# Patient Record
Sex: Female | Born: 1975 | Hispanic: Yes | Marital: Married
Health system: Southern US, Community
[De-identification: ages and names within clinical notes are randomized; demographics above are authoritative.]

---

## 2015-08-24 HISTORY — PX: BREAST CYST EXCISION: SHX579

## 2019-12-12 ENCOUNTER — Ambulatory Visit: Payer: Self-pay

## 2019-12-19 ENCOUNTER — Other Ambulatory Visit: Payer: Self-pay

## 2019-12-19 ENCOUNTER — Ambulatory Visit: Payer: Self-pay

## 2020-01-23 ENCOUNTER — Other Ambulatory Visit: Payer: Self-pay

## 2020-01-23 ENCOUNTER — Ambulatory Visit: Payer: Self-pay | Attending: Oncology | Admitting: *Deleted

## 2020-01-23 VITALS — BP 124/72 | HR 71 | Temp 98.3°F | Ht <= 58 in | Wt 153.9 lb

## 2020-01-23 DIAGNOSIS — N63 Unspecified lump in unspecified breast: Secondary | ICD-10-CM

## 2020-01-24 NOTE — Progress Notes (Signed)
44 year old female presented to [facility] for clinical breast exam and mammogram.  It was noted during the health history that patient is insured by [employer].  She is currently teaching at [employer].  She states her last mammogram was in [place] in January of 2021.  States she is due for a 6 month follow up mammogram.  Patient has her images and the report with her.  Since she is not eligible for B[facility]CP with insurance, I have sent her to [facility] breast care center  To have her imaging downloaded.  She is going to call the [facility] to have them send the order so she can get scheduled.  Will also have Joellyn Quails call the clinic for an order and notify the patient.

## 2020-02-04 ENCOUNTER — Other Ambulatory Visit: Payer: Self-pay | Admitting: Physician Assistant

## 2020-02-04 DIAGNOSIS — R928 Other abnormal and inconclusive findings on diagnostic imaging of breast: Secondary | ICD-10-CM

## 2020-02-05 ENCOUNTER — Other Ambulatory Visit: Payer: Self-pay

## 2020-02-05 ENCOUNTER — Ambulatory Visit (LOCAL_COMMUNITY_HEALTH_CENTER): Payer: Self-pay

## 2020-02-05 DIAGNOSIS — Z111 Encounter for screening for respiratory tuberculosis: Secondary | ICD-10-CM

## 2020-02-08 ENCOUNTER — Telehealth: Payer: Self-pay

## 2020-02-08 ENCOUNTER — Other Ambulatory Visit: Payer: Self-pay

## 2020-02-08 ENCOUNTER — Ambulatory Visit (LOCAL_COMMUNITY_HEALTH_CENTER): Payer: Self-pay

## 2020-02-08 DIAGNOSIS — Z111 Encounter for screening for respiratory tuberculosis: Secondary | ICD-10-CM

## 2020-02-08 LAB — TB SKIN TEST
Induration: 0 mm
TB Skin Test: NEGATIVE

## 2020-02-08 NOTE — Telephone Encounter (Signed)
[facility] as scheduled for PPDR this am. Call to client with [facility] [employer] (ID # [phone]) and no answer / no voicemail set up. Jossie Ng, RN

## 2020-02-08 NOTE — Progress Notes (Signed)
Verified level of service completed. Jossie Ng, RN

## 2020-02-15 ENCOUNTER — Ambulatory Visit
Admission: RE | Admit: 2020-02-15 | Discharge: 2020-02-15 | Disposition: A | Discharge status: Home / Self Care | Payer: BC Managed Care – PPO | Source: Ambulatory Visit | Attending: Physician Assistant | Admitting: Physician Assistant

## 2020-02-15 ENCOUNTER — Encounter: Payer: Self-pay | Admitting: Radiology

## 2020-02-15 DIAGNOSIS — R928 Other abnormal and inconclusive findings on diagnostic imaging of breast: Secondary | ICD-10-CM | POA: Diagnosis not present

## 2020-02-20 ENCOUNTER — Other Ambulatory Visit: Payer: Self-pay | Admitting: Physician Assistant

## 2020-02-20 DIAGNOSIS — Z1231 Encounter for screening mammogram for malignant neoplasm of breast: Secondary | ICD-10-CM

## 2020-02-20 DIAGNOSIS — N6489 Other specified disorders of breast: Secondary | ICD-10-CM

## 2020-02-20 DIAGNOSIS — R928 Other abnormal and inconclusive findings on diagnostic imaging of breast: Secondary | ICD-10-CM

## 2021-02-05 ENCOUNTER — Other Ambulatory Visit: Payer: Self-pay

## 2021-02-05 ENCOUNTER — Emergency Department
Admission: EM | Admit: 2021-02-05 | Discharge: 2021-02-05 | Disposition: A | Discharge status: Home / Self Care | Payer: BC Managed Care – PPO | Attending: Emergency Medicine | Admitting: Emergency Medicine

## 2021-02-05 DIAGNOSIS — Z20822 Contact with and (suspected) exposure to covid-19: Secondary | ICD-10-CM | POA: Insufficient documentation

## 2021-02-05 DIAGNOSIS — J029 Acute pharyngitis, unspecified: Secondary | ICD-10-CM | POA: Diagnosis not present

## 2021-02-05 DIAGNOSIS — R059 Cough, unspecified: Secondary | ICD-10-CM | POA: Diagnosis present

## 2021-02-05 DIAGNOSIS — J069 Acute upper respiratory infection, unspecified: Secondary | ICD-10-CM | POA: Diagnosis not present

## 2021-02-05 LAB — GROUP A STREP BY PCR: Group A Strep by PCR: NOT DETECTED

## 2021-02-05 LAB — SARS CORONAVIRUS 2 (TAT 6-24 HRS): SARS Coronavirus 2: NEGATIVE

## 2021-02-05 MED ORDER — PSEUDOEPH-BROMPHEN-DM 30-2-10 MG/5ML PO SYRP: 5.0000 mL | ORAL_SOLUTION | Freq: Four times a day (QID) | ORAL | 0 refills | Status: DC | PRN

## 2021-02-05 MED ORDER — IBUPROFEN 800 MG PO TABS: 800.0000 mg | ORAL_TABLET | Freq: Three times a day (TID) | ORAL | 0 refills | Status: DC | PRN

## 2021-02-05 MED ORDER — LIDOCAINE VISCOUS HCL 2 % MT SOLN: 5.0000 mL | Freq: Four times a day (QID) | OROMUCOSAL | 0 refills | Status: DC | PRN

## 2021-02-05 NOTE — ED Notes (Signed)
See triage note  Presents with body aches and cough  States sx's started a few days ago  Afebrile at presents but stats has been taking IBU

## 2021-02-05 NOTE — ED Triage Notes (Signed)
Pt to ED POV for sore throat, cough and body aches x2 days Took ibuprofen with no relief Pt in NAD

## 2021-02-05 NOTE — ED Provider Notes (Signed)
[facility] Emergency Department Provider Note   ____________________________________________   Event Date/Time   First MD Initiated Contact with Patient 02/05/21 0825     (approximate)  I have reviewed the triage vital signs and the nursing notes.   HISTORY  Chief Complaint Sore Throat    HPI Emily Carson is a 45 y.o. female patient complain of sore throat, nonproductive cough, and body aches for 2 days.  Patient that she took ibuprofen with no relief.  Patient denies recent travel or known contact with COVID-19.  Patient is taking the COVID-vaccine with no booster.  Rates her pain as a 5/10.  Described pain as "achy".  No other palliative measure for complaint.          History reviewed. No pertinent past medical history.  There are no problems to display for this patient.   Past Surgical History:  Procedure Laterality Date   BREAST CYST EXCISION Right 2017    Prior to Admission medications   Medication Sig Start Date End Date Taking? Authorizing Provider  brompheniramine-pseudoephedrine-DM 30-2-10 MG/5ML syrup Take 5 mLs by mouth 4 (four) times daily as needed. Mix with 5 mL of viscous lidocaine for swish and swallow 02/05/21  Yes Joni Reining, PA-C  ibuprofen (ADVIL) 800 MG tablet Take 1 tablet (800 mg total) by mouth every 8 (eight) hours as needed for moderate pain. 02/05/21  Yes Joni Reining, PA-C  lidocaine (XYLOCAINE) 2 % solution Use as directed 5 mLs in the mouth or throat every 6 (six) hours as needed for mouth pain. Mix with 5 mL of Bromfed-DM for swish and swallow 02/05/21  Yes Joni Reining, PA-C    Allergies Patient has no known allergies.  No family history on file.  Social History    Review of Systems Constitutional: No fever/chills.  Body aches. Eyes: No visual changes. ENT: Sore throat.   Cardiovascular: Denies chest pain. Respiratory: Denies shortness of breath.  Nonproductive cough. Gastrointestinal:  No abdominal pain.  No nausea, no vomiting.  No diarrhea.  No constipation. Genitourinary: Negative for dysuria. Musculoskeletal: Negative for back pain. Skin: Negative for rash. Neurological: Negative for headaches, focal weakness or numbness.   ____________________________________________   PHYSICAL EXAM:  VITAL SIGNS: ED Triage Vitals  Enc Vitals Group     BP 02/05/21 0814 (!) 143/87     Pulse Rate 02/05/21 0814 90     Resp 02/05/21 0814 18     Temp 02/05/21 0814 98.7 F (37.1 C)     Temp Source 02/05/21 0814 Oral     SpO2 02/05/21 0814 100 %     Weight 02/05/21 0818 150 lb (68 kg)     Height 02/05/21 0818 5' (1.524 m)     Head Circumference --      Peak Flow --      Pain Score 02/05/21 0818 5     Pain Loc --      Pain Edu? --      Excl. in GC? --     Constitutional: Alert and oriented. Well appearing and in no acute distress. Eyes: Conjunctivae are normal. PERRL. EOMI. Head: Atraumatic. Nose: No congestion/rhinnorhea.  Clear rhinorrhea. Mouth/Throat: Mucous membranes are moist.  Oropharynx non-erythematous. Neck: No stridor.   Hematological/Lymphatic/Immunilogical: No cervical lymphadenopathy. Cardiovascular: Normal rate, regular rhythm. Grossly normal heart sounds.  Good peripheral circulation. Respiratory: Normal respiratory effort.  No retractions. Lungs CTAB. Gastrointestinal: Soft and nontender. No distention. No abdominal bruits. No CVA tenderness. Genitourinary: Deferred  Skin:  Skin is warm, dry and intact. No rash noted. Psychiatric: Mood and affect are normal. Speech and behavior are normal.  ____________________________________________   LABS (all labs ordered are listed, but only abnormal results are displayed)  Labs Reviewed  GROUP A STREP BY PCR  SARS CORONAVIRUS 2 (TAT 6-24 HRS)   ____________________________________________  EKG   ____________________________________________  RADIOLOGY Margarite Gouge, personally viewed and evaluated  these images (plain radiographs) as part of my medical decision making, as well as reviewing the written report by the radiologist.  ED MD interpretation:    Official radiology report(s): No results found.  ____________________________________________   PROCEDURES  Procedure(s) performed (including Critical Care):  Procedures   ____________________________________________   INITIAL IMPRESSION / ASSESSMENT AND PLAN / ED COURSE  As part of my medical decision making, I reviewed the following data within the electronic MEDICAL RECORD NUMBER         Patient presents with 2 days of sore throat, cough, body aches.  Patient rapid strep test was negative.  COVID-19 test is pending.  Patient's complaint and physical exam is consistent with viral illness.  Patient given discharge care instruction advised take medication as directed.  Patient advised to self quarantine pending results of COVID-19 test.  If test is positive must quarantine additional 5 days.  Follow-up with PCP.     ____________________________________________   FINAL CLINICAL IMPRESSION(S) / ED DIAGNOSES  Final diagnoses:  Viral pharyngitis  Viral URI with cough     ED Discharge Orders          Ordered    brompheniramine-pseudoephedrine-DM 30-2-10 MG/5ML syrup  4 times daily PRN        02/05/21 0953    lidocaine (XYLOCAINE) 2 % solution  Every 6 hours PRN        02/05/21 0953    ibuprofen (ADVIL) 800 MG tablet  Every 8 hours PRN        02/05/21 0953             Note:  This document was prepared using Dragon voice recognition software and may include unintentional dictation errors.    Joni Reining, PA-C 02/05/21 0263    Chesley Noon, MD 02/05/21 1149

## 2021-02-05 NOTE — Discharge Instructions (Addendum)
Your rapid strep test was negative.  Your COVID-19 test is pending.  Results will be available later in the MyChart app.  Read and follow discharge care instruction take medication as directed.

## 2021-07-14 IMAGING — US US BREAST*R* LIMITED INC AXILLA
1 series · 1 of 1 positions shown · non-contrast
Comparison: Mammograms from [facility], [facility], [REDACTED] dated
08/28/2019.

CLINICAL DATA: Follow-up of probable benign findings in the right
breast seen on prior mammograms from [REDACTED] dated 08/28/2019.

EXAM:
DIGITAL DIAGNOSTIC RIGHT MAMMOGRAM WITH CAD AND TOMO
ULTRASOUND RIGHT BREAST

[Series 1: us breast*right* limited inc axilla · 0.07mm/px · 1 of 1 slices shown]
[im 1/1]
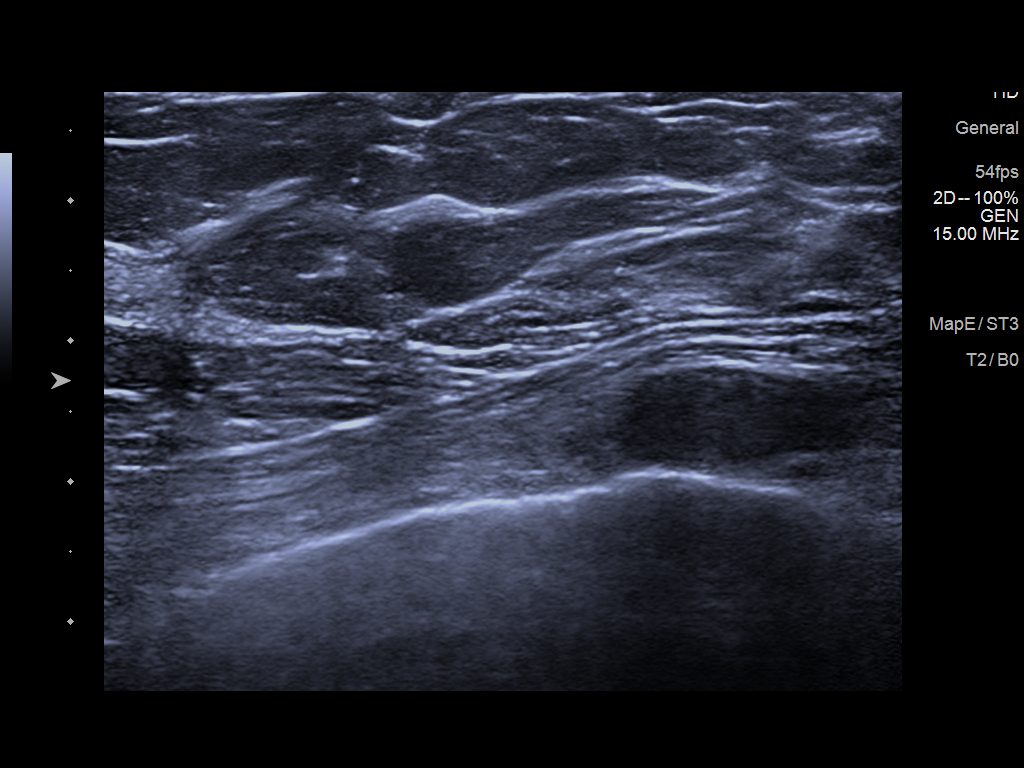

[1 of 1 positions shown; findings below may reference images not displayed]

ACR Breast Density Category c: The breast tissue is heterogeneously
dense, which may obscure small masses.
FINDINGS: Asymmetries in the medial aspect of the right breast are unchanged
compared to the prior exam. No suspicious mass or malignant type
microcalcifications identified.

Mammographic images were processed with CAD.

On physical exam, I do not palpate a mass in the medial aspect of
the right breast.

Targeted ultrasound is performed, showing normal tissue in the
medial aspect of the right breast. No solid or cystic mass, abnormal
shadowing or distortion visualized.
IMPRESSION: Stable probable benign asymmetries in the right breast.

RECOMMENDATION:
Bilateral diagnostic mammogram in Sunday August, 2020 is recommended.

I have discussed the findings and recommendations with the patient.
If applicable, a reminder letter will be sent to the patient
regarding the next appointment.

BI-RADS CATEGORY  3: Probably benign.

## 2021-07-14 IMAGING — MG MM DIGITAL DIAGNOSTIC UNILAT*R* W/ TOMO W/ CAD
8 series · 8 of 24 positions shown · non-contrast
Comparison: Mammograms from [facility], [facility], [REDACTED] dated
08/28/2019.

CLINICAL DATA: Follow-up of probable benign findings in the right
breast seen on prior mammograms from [REDACTED] dated 08/28/2019.

EXAM:
DIGITAL DIAGNOSTIC RIGHT MAMMOGRAM WITH CAD AND TOMO
ULTRASOUND RIGHT BREAST

[R CC synth-2D (1 of 3)]
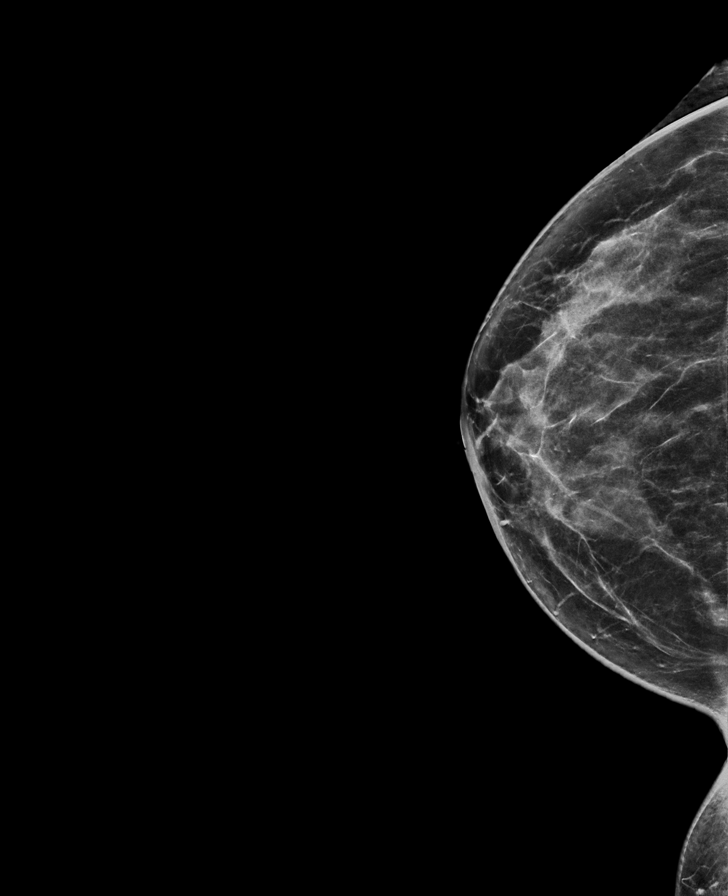

[R CC synth-2D (2 of 3)]
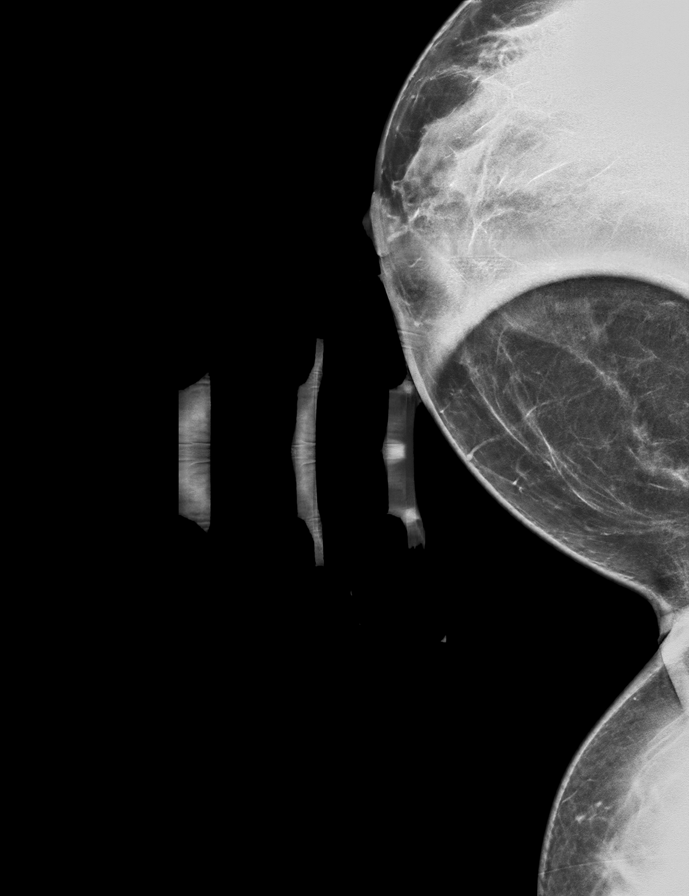

[R CC synth-2D (3 of 3)]
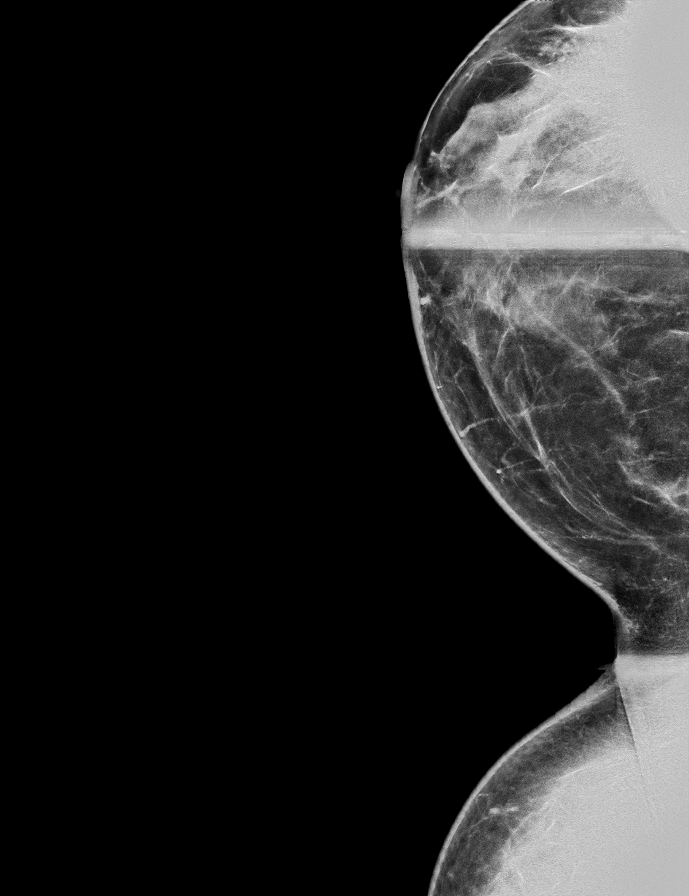

[R MLO synth-2D]
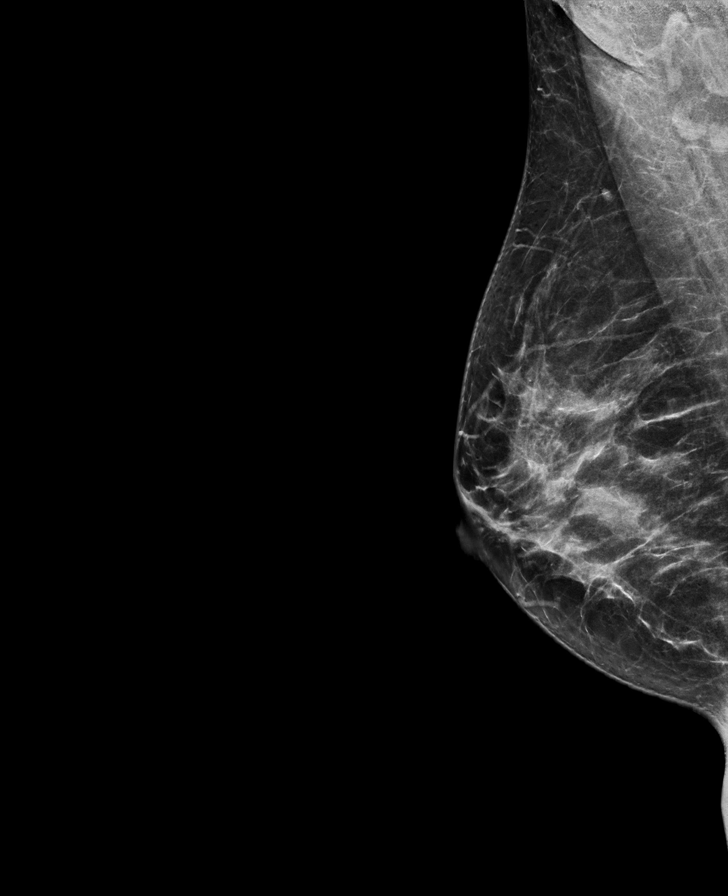

[R CC tomo (1 of 3) · tomo slice 35/69.0]
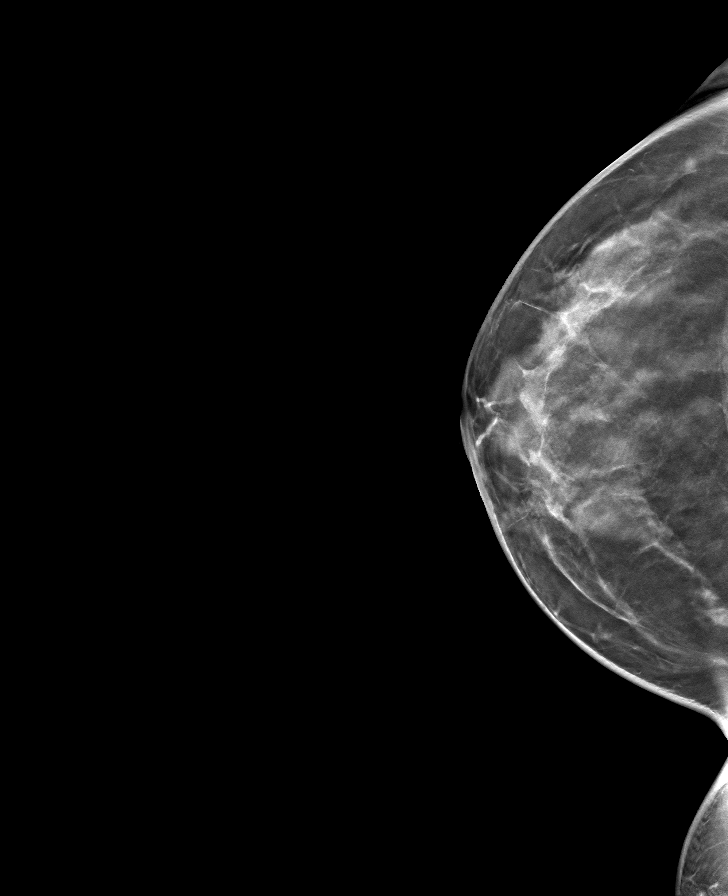

[R MLO tomo · tomo slice 34/67.0]
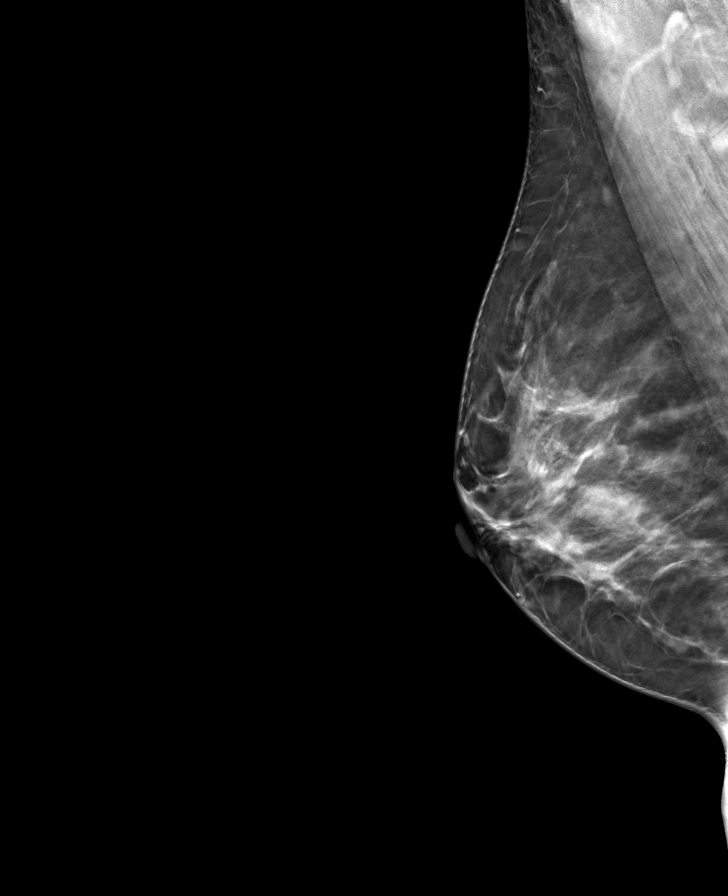

[R CC tomo (2 of 3) · tomo slice 31/62.0]
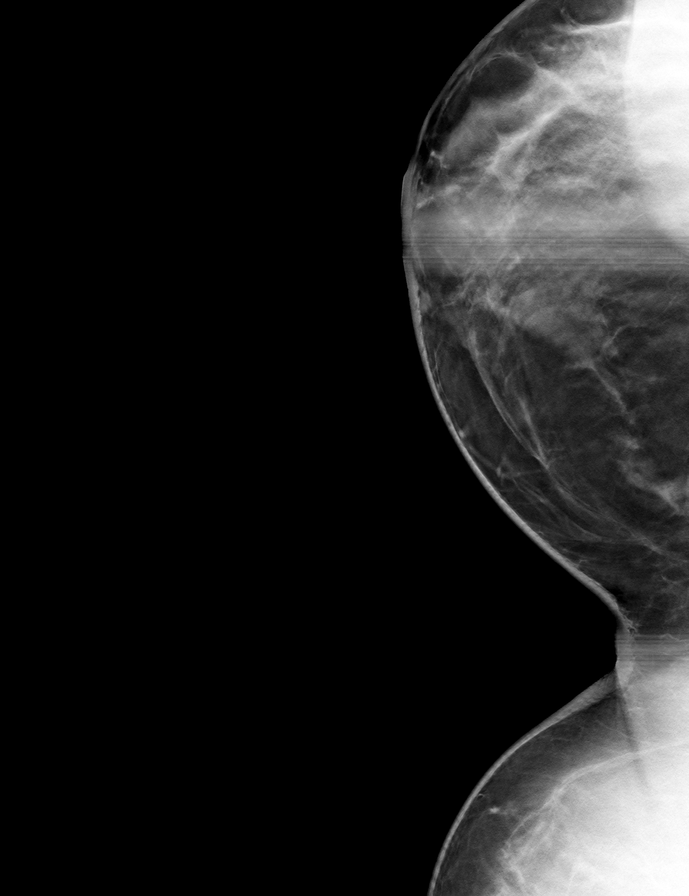

[R CC tomo (3 of 3) · tomo slice 31/60.0]
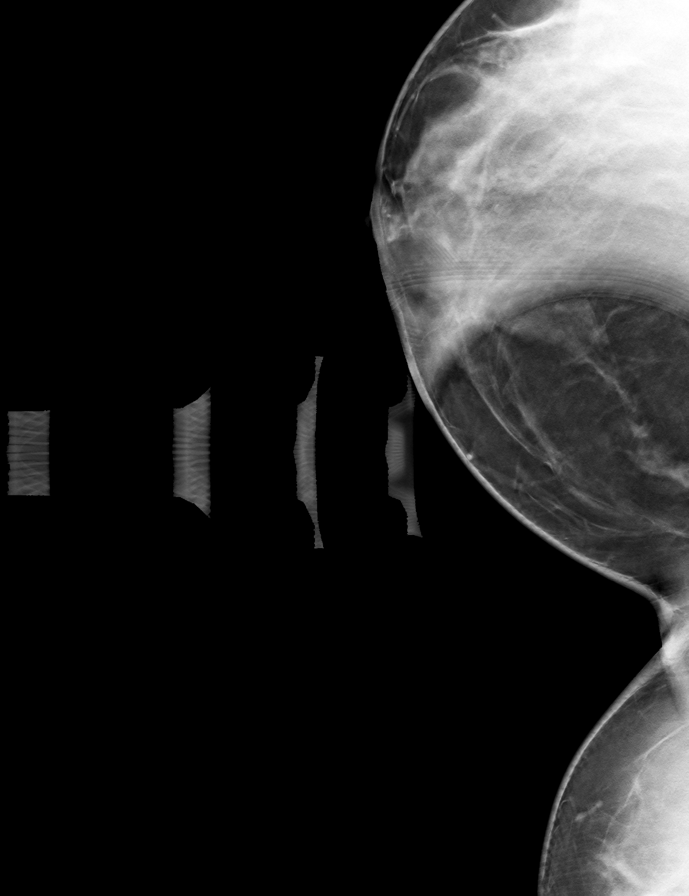

[8 of 24 positions shown; findings below may reference images not displayed]

ACR Breast Density Category c: The breast tissue is heterogeneously
dense, which may obscure small masses.
FINDINGS: Asymmetries in the medial aspect of the right breast are unchanged
compared to the prior exam. No suspicious mass or malignant type
microcalcifications identified.

Mammographic images were processed with CAD.

On physical exam, I do not palpate a mass in the medial aspect of
the right breast.

Targeted ultrasound is performed, showing normal tissue in the
medial aspect of the right breast. No solid or cystic mass, abnormal
shadowing or distortion visualized.
IMPRESSION: Stable probable benign asymmetries in the right breast.

RECOMMENDATION:
Bilateral diagnostic mammogram in Sunday August, 2020 is recommended.

I have discussed the findings and recommendations with the patient.
If applicable, a reminder letter will be sent to the patient
regarding the next appointment.

BI-RADS CATEGORY  3: Probably benign.

## 2023-08-29 ENCOUNTER — Ambulatory Visit (INDEPENDENT_AMBULATORY_CARE_PROVIDER_SITE_OTHER): Payer: 59 | Admitting: Family Medicine

## 2023-08-29 ENCOUNTER — Encounter: Payer: Self-pay | Admitting: Family Medicine

## 2023-08-29 VITALS — BP 128/73 | HR 65 | Resp 16 | Ht 61.0 in | Wt 159.3 lb

## 2023-08-29 DIAGNOSIS — Z1159 Encounter for screening for other viral diseases: Secondary | ICD-10-CM

## 2023-08-29 DIAGNOSIS — Z1329 Encounter for screening for other suspected endocrine disorder: Secondary | ICD-10-CM

## 2023-08-29 DIAGNOSIS — R5382 Chronic fatigue, unspecified: Secondary | ICD-10-CM | POA: Insufficient documentation

## 2023-08-29 DIAGNOSIS — Z114 Encounter for screening for human immunodeficiency virus [HIV]: Secondary | ICD-10-CM

## 2023-08-29 DIAGNOSIS — Z1211 Encounter for screening for malignant neoplasm of colon: Secondary | ICD-10-CM | POA: Insufficient documentation

## 2023-08-29 DIAGNOSIS — Z0000 Encounter for general adult medical examination without abnormal findings: Secondary | ICD-10-CM | POA: Insufficient documentation

## 2023-08-29 DIAGNOSIS — E559 Vitamin D deficiency, unspecified: Secondary | ICD-10-CM

## 2023-08-29 DIAGNOSIS — Z13220 Encounter for screening for lipoid disorders: Secondary | ICD-10-CM

## 2023-08-29 DIAGNOSIS — Z0001 Encounter for general adult medical examination with abnormal findings: Secondary | ICD-10-CM | POA: Diagnosis not present

## 2023-08-29 DIAGNOSIS — L853 Xerosis cutis: Secondary | ICD-10-CM

## 2023-08-29 DIAGNOSIS — Z1231 Encounter for screening mammogram for malignant neoplasm of breast: Secondary | ICD-10-CM

## 2023-08-29 DIAGNOSIS — Z130 Encounter for screening for diseases of the blood and blood-forming organs and certain disorders involving the immune mechanism: Secondary | ICD-10-CM

## 2023-08-29 NOTE — Progress Notes (Signed)
 New patient visit   Patient: Emily Carson   DOB: 09-Jun-1976   48 y.o. Female  MRN: [id number] Visit Date: 08/29/2023  Today's healthcare provider: LAURAINE LOISE BUOY, DO   Chief Complaint  Patient presents with   New Patient (Initial Visit)   Subjective    Emily Carson is a 48 y.o. female who presents today as a new patient to establish care.  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 She reported feeling tired and occasionally experiencing episodes of crying, feeling low, and feeling down. These episodes occur approximately once a month and sometimes seem to coincide with her menstrual cycle, leading to concerns about the onset of menopause.  The patient's menstrual cycle is regular, occurring approximately every 26 days, with a regular flow lasting three days, followed by two days of spotting. She reported no significant changes in her menstrual cycle.  - LMP: 08/13/2023  In addition to these symptoms, the patient reported occasional episodes of dizziness, described as a sensation of the world spinning around her. These episodes last for about 5-10 seconds and occur randomly, without any identifiable triggers. She also reported a persistent issue with dry, cracking skin on her hands, which has been ongoing for several years and seems to worsen with stress or anxiety.  The patient is currently studying for a Master's degree and suspects that her symptoms may be exacerbated by the stress and fatigue associated with her studies and work.  The patient has a history of a cesarean section and cyst removal, and is sexually active. She underwent a hysterectomy 11 years ago. She reported no recent changes in bowel habits, no nausea or vomiting, and no numbness or tingling. She has been experiencing increased sensitivity to touch, which she  described as a mild pain when pinching or scratching her skin.  In summary, the patient presented with intermittent fatigue, emotional lability, occasional dizziness, and skin sensitivity, with concerns about the onset of menopause. She is interested in undergoing routine health screenings and having her blood work done to assess her overall health status.  She reported no family history of diabetes or colon cancer.   History reviewed. No pertinent past medical history. Past Surgical History:  Procedure Laterality Date   BREAST CYST EXCISION Right 2017   CESAREAN SECTION  02/05/2002   Family Status  Relation Name Status   Mother Emily Carson (Not Specified)   MGM Emily Carson (Not Specified)  No partnership data on file   Family History  Problem Relation Age of Onset   Heart disease Mother    Vision loss Maternal Grandmother    Social History   Socioeconomic History   Marital status: Married    Spouse name: Not on file   Number of children: Not on file   Years of education: Not on file   Highest education level: Bachelor's degree (e.g., BA, AB, BS)  Occupational History   Not on file  Tobacco Use   Smoking status: Never   Smokeless tobacco: Never  Vaping Use   Vaping status: Never Used  Substance and Sexual Activity   Alcohol use: Yes    Alcohol/week: 2.0 standard drinks of alcohol    Types: 2 Cans of beer per week    Comment: Sometimes a month   Drug use: Never   Sexual activity: Yes    Birth control/protection: Surgical  Other Topics Concern   Not on file  Social History Narrative   Not on file   Social Drivers of Health   Financial Resource Strain: Low Risk  (08/25/2023)   Overall Financial Resource Strain (CARDIA)    Difficulty of Paying Living Expenses: Not hard at all  Food Insecurity: Patient Declined (08/25/2023)   Hunger Vital Sign    Worried About Running Out of Food in the Last Year: Patient declined    Ran Out of Food in the Last Year: Patient  declined  Transportation Needs: No Transportation Needs (08/25/2023)   PRAPARE - Administrator, Civil Service (Medical): No    Lack of Transportation (Non-Medical): No  Physical Activity: Insufficiently Active (08/25/2023)   Exercise Vital Sign    Days of Exercise per Week: 3 days    Minutes of Exercise per Session: 10 min  Stress: No Stress Concern Present (08/25/2023)   [employer] of Occupational Health - Occupational Stress Questionnaire    Feeling of Stress : Only a little  Social Connections: Unknown (08/25/2023)   Social Connection and Isolation Panel [NHANES]    Frequency of Communication with Friends and Family: Once a week    Frequency of Social Gatherings with Friends and Family: Patient declined    Attends Religious Services: More than 4 times per year    Active Member of Clubs or Organizations: Yes    Attends Engineer, Structural: More than 4 times per year    Marital Status: Married   Outpatient Medications Prior to Visit  Medication Sig   [DISCONTINUED] brompheniramine-pseudoephedrine-DM 30-2-10 MG/5ML syrup Take 5 mLs by mouth 4 (four) times daily as needed. Mix with 5 mL of viscous lidocaine  for swish and swallow   [DISCONTINUED] ibuprofen  (ADVIL ) 800 MG tablet Take 1 tablet (800 mg total) by mouth every 8 (eight) hours as needed for moderate pain.   [DISCONTINUED] lidocaine  (XYLOCAINE ) 2 % solution Use as directed 5 mLs in the mouth or throat every 6 (six) hours as needed for mouth pain. Mix with 5 mL of Bromfed-DM for swish and swallow   No facility-administered medications prior to visit.   No Known Allergies  Immunization History  Administered Date(s) Administered   PPD Test 02/05/2020    Health Maintenance  Topic Date Due   Cervical Cancer Screening (HPV/Pap Cotest)  Never done   Colonoscopy  Never done   DTaP/Tdap/Td (1 - Tdap) 10/24/2023 (Originally 05/26/1997)   INFLUENZA VACCINE  11/21/2023 (Originally 03/24/2023)   COVID-19 Vaccine  (1 - 2024-25 season) 04/23/2024 (Originally 04/24/2023)   Hepatitis C Screening  Completed   HIV Screening  Completed   HPV VACCINES  Aged Out    Patient Care Team: Tobiah Celestine, Lauraine SAILOR, DO as PCP - General (Family Medicine) Cindie Jesusa HERO, RN as Registered Nurse Dannielle Arlean FALCON, RN (Inactive) as Registered Nurse  Review of Systems  Constitutional:  Positive for fatigue. Negative for appetite change, chills and fever.  HENT:  Negative for congestion, ear pain, rhinorrhea and sore throat.   Eyes:  Negative for visual disturbance.  Respiratory:  Negative for chest tightness and shortness of breath.   Cardiovascular:  Negative for chest pain and palpitations.  Gastrointestinal:  Positive for constipation (intermittent). Negative for abdominal pain, diarrhea, nausea and vomiting.  Genitourinary:  Negative for dysuria, frequency and urgency.  Musculoskeletal:  Negative for arthralgias.  Skin:  Negative for color change and rash.       +breakdown on pads of fingers intermittently  Neurological:  Positive for dizziness (described as room spinning). Negative for weakness and light-headedness.  Psychiatric/Behavioral:  The patient is nervous/anxious (intermittently).         Objective    BP 128/73   Pulse 65   Resp 16   Ht 5' 1 (1.549 m)   Wt 159 lb 4.8 oz (72.3 kg)   SpO2 100%   BMI 30.10 kg/m     Physical Exam Vitals and nursing note reviewed.  Constitutional:      General: She is awake. She is not in acute distress.    Appearance: Normal appearance.  HENT:     Head: Normocephalic and atraumatic.     Right Ear: Tympanic membrane, ear canal and external ear normal.     Left Ear: Tympanic membrane, ear canal and external ear normal.     Nose: Nose normal.     Mouth/Throat:     Mouth: Mucous membranes are moist.     Pharynx: Oropharynx is clear. No oropharyngeal exudate or posterior oropharyngeal erythema.  Eyes:     General: No scleral icterus.    Extraocular Movements:  Extraocular movements intact.     Conjunctiva/sclera: Conjunctivae normal.     Pupils: Pupils are equal, round, and reactive to light.  Neck:     Thyroid: No thyromegaly or thyroid tenderness.  Cardiovascular:     Rate and Rhythm: Normal rate and regular rhythm.     Pulses: Normal pulses.     Heart sounds: Normal heart sounds.  Pulmonary:     Effort: Pulmonary effort is normal. No tachypnea, bradypnea or respiratory distress.     Breath sounds: Normal breath sounds. No stridor. No wheezing, rhonchi or rales.  Abdominal:     General: Bowel sounds are normal. There is no distension.     Palpations: Abdomen is soft. There is no mass.     Tenderness: There is no abdominal tenderness. There is no guarding.     Hernia: No hernia is present.  Musculoskeletal:     Cervical back: Normal range of motion and neck supple.     Right lower leg: No edema.     Left lower leg: No edema.  Lymphadenopathy:     Cervical: No cervical adenopathy.  Skin:    General: Skin is warm and dry.  Neurological:     Mental Status: She is alert and oriented to person, place, and time. Mental status is at baseline.  Psychiatric:        Mood and Affect: Mood normal.        Behavior: Behavior normal.     Depression Screen    08/29/2023    3:48 PM  PHQ 2/9 Scores  PHQ - 2 Score 1  PHQ- 9 Score 2   Results for orders placed or performed in visit on 08/29/23  Comprehensive metabolic panel  Result Value Ref Range   Glucose 93 70 - 99 mg/dL   BUN 6 6 - 24 mg/dL   Creatinine, Ser 9.27 0.57 - 1.00 mg/dL   eGFR 894 >40 fO/fpw/8.26   BUN/Creatinine Ratio 8 (L) 9 - 23   Sodium 140 134 - 144 mmol/L   Potassium 4.1 3.5 - 5.2 mmol/L   Chloride 106 96 - 106 mmol/L   CO2 21 20 - 29 mmol/L   Calcium 9.2 8.7 - 10.2 mg/dL   Total Protein 7.0 6.0 - 8.5 g/dL   Albumin 4.2 3.9 - 4.9 g/dL   Globulin, Total 2.8 1.5 - 4.5 g/dL  Bilirubin Total 0.3 0.0 - 1.2 mg/dL   Alkaline Phosphatase 81 44 - 121 IU/L   AST 23 0 - 40  IU/L   ALT 32 0 - 32 IU/L  Hemoglobin A1c  Result Value Ref Range   Hgb A1c MFr Bld 5.6 4.8 - 5.6 %   Est. average glucose Bld gHb Est-mCnc 114 mg/dL  Lipid panel  Result Value Ref Range   Cholesterol, Total 179 100 - 199 mg/dL   Triglycerides 99 0 - 149 mg/dL   HDL 50 >60 mg/dL   VLDL Cholesterol Cal 18 5 - 40 mg/dL   LDL Chol Calc (NIH) 888 (H) 0 - 99 mg/dL   Chol/HDL Ratio 3.6 0.0 - 4.4 ratio  VITAMIN D  25 Hydroxy (Vit-D Deficiency, Fractures)  Result Value Ref Range   Vit D, 25-Hydroxy 14.9 (L) 30.0 - 100.0 ng/mL  TSH Rfx on Abnormal to Free T4  Result Value Ref Range   TSH 2.110 0.450 - 4.500 uIU/mL  HIV Antibody (routine testing w rflx)  Result Value Ref Range   HIV Screen 4th Generation wRfx Non Reactive Non Reactive  HCV Ab w Reflex to Quant PCR  Result Value Ref Range   HCV Ab Non Reactive Non Reactive  Vitamin B12  Result Value Ref Range   Vitamin B-12 709 232 - 1,245 pg/mL  CBC  Result Value Ref Range   WBC 7.0 3.4 - 10.8 x10E3/uL   RBC 4.47 3.77 - 5.28 x10E6/uL   Hemoglobin 13.0 11.1 - 15.9 g/dL   Hematocrit 60.1 65.9 - 46.6 %   MCV 89 79 - 97 fL   MCH 29.1 26.6 - 33.0 pg   MCHC 32.7 31.5 - 35.7 g/dL   RDW 87.5 88.2 - 84.5 %   Platelets 315 150 - 450 x10E3/uL  Interpretation:  Result Value Ref Range   HCV Interp 1: Comment     Assessment & Plan     Encounter for medical examination to establish care Assessment & Plan: Physical exam overall unremarkable except as noted above. Routine lab work ordered as noted. Due for several routine screenings and vaccinations. No history of abnormal Pap smears. Last Pap smear in 2020 in [place]. Last mammogram in 2021. No colonoscopy history. Last tetanus vaccine likely more than 10 years ago. Expressed interest in breast cancer screening and Pap smear. - Order mammogram - Refer for colonoscopy - Schedule follow-up for Pap smear - Encourage COVID booster at pharmacy - Encourage providing tetanus vaccine record or  get updated vaccine if due   Chronic fatigue Assessment & Plan: Ongoing fatigue, emotional lability, and low mood, possibly associated with menstrual cycle. Symptoms occur approximately once a month and may be related to hormonal changes. Reports regular menstrual cycles every 26 days with a total duration of five days, including three days of regular flow and two days of spotting. - Order blood work including vitamin B12, vitamin D , thyroid function tests, and anemia panel - Encourage regular exercise to help with mood and energy levels  Orders: -     VITAMIN D  25 Hydroxy (Vit-D Deficiency, Fractures) -     TSH Rfx on Abnormal to Free T4 -     Vitamin B12 -     CBC  Dry skin Assessment & Plan: Chronic dry skin with cracking, particularly on hands and feet. Symptoms may be exacerbated by stress and anxiety. Possible contribution from thyroid dysfunction. Uses gloves while cooking and washing to manage symptoms. - Check thyroid function as part  of blood work - Advise using thick lotion and gloves at night - Encourage switching lotions if current one is ineffective   Encounter for screening mammogram for breast cancer -     3D Screening Mammogram, Left and Right; Future  Screening for lipid disorders -     Lipid panel  Screening for deficiency anemia -     CBC  Screening for endocrine, metabolic and immunity disorder -     Comprehensive metabolic panel -     Hemoglobin A1c  Encounter for hepatitis C screening test for low risk patient -     HCV Ab w Reflex to Quant PCR -     Interpretation:  Encounter for screening for HIV -     HIV Antibody (routine testing w rflx)  Encounter for colorectal cancer screening -     Ambulatory referral to Gastroenterology  Occasional Dizziness Occasional dizziness lasting 5-10 seconds, occurring randomly. No clear triggers identified. No associated symptoms like ear pain or palpitations. Reports dizziness has been ongoing for several  years. - Monitor symptoms and report any changes or patterns   Return in about 1 month (around 09/29/2023) for Pap smear.     I discussed the assessment and treatment plan with the patient  The patient was provided an opportunity to ask questions and all were answered. The patient agreed with the plan and demonstrated an understanding of the instructions.   The patient was advised to call back or seek an in-person evaluation if the symptoms worsen or if the condition fails to improve as anticipated.    LAURAINE LOISE BUOY, DO  [facility] [phone] (phone) [phone] (fax)  [facility]

## 2023-08-29 NOTE — Patient Instructions (Signed)
 Please call the [facility] [phone]) to schedule a routine screening mammogram.

## 2023-08-30 LAB — HCV INTERPRETATION

## 2023-08-30 LAB — COMPREHENSIVE METABOLIC PANEL
ALT: 32 [IU]/L (ref 0–32)
AST: 23 [IU]/L (ref 0–40)
Albumin: 4.2 g/dL (ref 3.9–4.9)
Alkaline Phosphatase: 81 [IU]/L (ref 44–121)
BUN/Creatinine Ratio: 8 — ABNORMAL LOW (ref 9–23)
BUN: 6 mg/dL (ref 6–24)
Bilirubin Total: 0.3 mg/dL (ref 0.0–1.2)
CO2: 21 mmol/L (ref 20–29)
Calcium: 9.2 mg/dL (ref 8.7–10.2)
Chloride: 106 mmol/L (ref 96–106)
Creatinine, Ser: 0.72 mg/dL (ref 0.57–1.00)
Globulin, Total: 2.8 g/dL (ref 1.5–4.5)
Glucose: 93 mg/dL (ref 70–99)
Potassium: 4.1 mmol/L (ref 3.5–5.2)
Sodium: 140 mmol/L (ref 134–144)
Total Protein: 7 g/dL (ref 6.0–8.5)
eGFR: 105 mL/min/{1.73_m2} (ref >59)

## 2023-08-30 LAB — HCV AB W REFLEX TO QUANT PCR: HCV Ab: NONREACTIVE

## 2023-08-30 LAB — HIV ANTIBODY (ROUTINE TESTING W REFLEX): HIV Screen 4th Generation wRfx: NONREACTIVE

## 2023-08-30 LAB — CBC
Hematocrit: 39.8 % (ref 34.0–46.6)
Hemoglobin: 13 g/dL (ref 11.1–15.9)
MCH: 29.1 pg (ref 26.6–33.0)
MCHC: 32.7 g/dL (ref 31.5–35.7)
MCV: 89 fL (ref 79–97)
Platelets: 315 10*3/uL (ref 150–450)
RBC: 4.47 x10E6/uL (ref 3.77–5.28)
RDW: 12.4 % (ref 11.7–15.4)
WBC: 7 10*3/uL (ref 3.4–10.8)

## 2023-08-30 LAB — LIPID PANEL
Chol/HDL Ratio: 3.6 {ratio} (ref 0.0–4.4)
Cholesterol, Total: 179 mg/dL (ref 100–199)
HDL: 50 mg/dL (ref >39)
LDL Chol Calc (NIH): 111 mg/dL — ABNORMAL HIGH (ref 0–99)
Triglycerides: 99 mg/dL (ref 0–149)
VLDL Cholesterol Cal: 18 mg/dL (ref 5–40)

## 2023-08-30 LAB — VITAMIN D 25 HYDROXY (VIT D DEFICIENCY, FRACTURES): Vit D, 25-Hydroxy: 14.9 ng/mL — ABNORMAL LOW (ref 30.0–100.0)

## 2023-08-30 LAB — TSH RFX ON ABNORMAL TO FREE T4: TSH: 2.11 u[IU]/mL (ref 0.450–4.500)

## 2023-08-30 LAB — VITAMIN B12: Vitamin B-12: 709 pg/mL (ref 232–1245)

## 2023-08-30 LAB — HEMOGLOBIN A1C
Est. average glucose Bld gHb Est-mCnc: 114 mg/dL
Hgb A1c MFr Bld: 5.6 % (ref 4.8–5.6)

## 2023-09-01 ENCOUNTER — Other Ambulatory Visit: Payer: Self-pay

## 2023-09-01 ENCOUNTER — Telehealth: Payer: Self-pay

## 2023-09-01 DIAGNOSIS — Z1211 Encounter for screening for malignant neoplasm of colon: Secondary | ICD-10-CM

## 2023-09-01 MED ORDER — NA SULFATE-K SULFATE-MG SULF 17.5-3.13-1.6 GM/177ML PO SOLN: 1.0000 | Freq: Once | ORAL | 0 refills | Status: AC

## 2023-09-01 NOTE — Telephone Encounter (Signed)
 Gastroenterology Pre-Procedure Review  Request Date: 10/17/23 Requesting Physician: Dr. Therisa  PATIENT REVIEW QUESTIONS: The patient responded to the following health history questions as indicated:    1. Are you having any GI issues? no 2. Do you have a personal history of Polyps? no 3. Do you have a family history of Colon Cancer or Polyps? no 4. Diabetes Mellitus? no 5. Joint replacements in the past 12 months?no 6. Major health problems in the past 3 months?no 7. Any artificial heart valves, MVP, or defibrillator?no    MEDICATIONS & ALLERGIES:    Patient reports the following regarding taking any anticoagulation/antiplatelet therapy:   Plavix, Coumadin, Eliquis, Xarelto, Lovenox, Pradaxa, Brilinta, or Effient? no Aspirin? no  Patient confirms/reports the following medications:  No current outpatient medications on file.   No current facility-administered medications for this visit.    Patient confirms/reports the following allergies:  No Known Allergies  No orders of the defined types were placed in this encounter.   AUTHORIZATION INFORMATION Primary Insurance: 1D#: Group #:  Secondary Insurance: 1D#: Group #:  SCHEDULE INFORMATION: Date: 10/17/23 Time: Location: [facility]

## 2023-09-04 ENCOUNTER — Encounter: Payer: Self-pay | Admitting: Family Medicine

## 2023-09-04 DIAGNOSIS — E559 Vitamin D deficiency, unspecified: Secondary | ICD-10-CM | POA: Insufficient documentation

## 2023-09-04 DIAGNOSIS — L853 Xerosis cutis: Secondary | ICD-10-CM | POA: Insufficient documentation

## 2023-09-04 MED ORDER — VITAMIN D (ERGOCALCIFEROL) 1.25 MG (50000 UNIT) PO CAPS: 50000.0000 [IU] | ORAL_CAPSULE | ORAL | 1 refills | Status: AC

## 2023-09-04 NOTE — Assessment & Plan Note (Signed)
 Ongoing fatigue, emotional lability, and low mood, possibly associated with menstrual cycle. Symptoms occur approximately once a month and may be related to hormonal changes. Reports regular menstrual cycles every 26 days with a total duration of five days, including three days of regular flow and two days of spotting. - Order blood work including vitamin B12, vitamin D , thyroid function tests, and anemia panel - Encourage regular exercise to help with mood and energy levels

## 2023-09-04 NOTE — Assessment & Plan Note (Signed)
 Chronic dry skin with cracking, particularly on hands and feet. Symptoms may be exacerbated by stress and anxiety. Possible contribution from thyroid dysfunction. Uses gloves while cooking and washing to manage symptoms. - Check thyroid function as part of blood work - Advise using thick lotion and gloves at night - Encourage switching lotions if current one is ineffective

## 2023-09-04 NOTE — Assessment & Plan Note (Signed)
 Physical exam overall unremarkable except as noted above. Routine lab work ordered as noted. Due for several routine screenings and vaccinations. No history of abnormal Pap smears. Last Pap smear in 2020 in [place]. Last mammogram in 2021. No colonoscopy history. Last tetanus vaccine likely more than 10 years ago. Expressed interest in breast cancer screening and Pap smear. - Order mammogram - Refer for colonoscopy - Schedule follow-up for Pap smear - Encourage COVID booster at pharmacy - Encourage providing tetanus vaccine record or get updated vaccine if due

## 2023-09-22 ENCOUNTER — Encounter: Payer: Self-pay | Admitting: Family Medicine

## 2023-10-17 ENCOUNTER — Encounter
Admission: RE | Disposition: A | Discharge status: Home / Self Care | Payer: Self-pay | Source: Home / Self Care | Attending: Gastroenterology

## 2023-10-17 ENCOUNTER — Ambulatory Visit: Payer: 59 | Admitting: Registered Nurse

## 2023-10-17 ENCOUNTER — Ambulatory Visit
Admission: RE | Admit: 2023-10-17 | Discharge: 2023-10-17 | Disposition: A | Discharge status: Home / Self Care | Payer: 59 | Attending: Gastroenterology | Admitting: Gastroenterology

## 2023-10-17 ENCOUNTER — Ambulatory Visit: Payer: 59 | Admitting: Family Medicine

## 2023-10-17 DIAGNOSIS — K5730 Diverticulosis of large intestine without perforation or abscess without bleeding: Secondary | ICD-10-CM | POA: Insufficient documentation

## 2023-10-17 DIAGNOSIS — Z1211 Encounter for screening for malignant neoplasm of colon: Secondary | ICD-10-CM

## 2023-10-17 HISTORY — PX: COLONOSCOPY WITH PROPOFOL: SHX5780

## 2023-10-17 SURGERY — COLONOSCOPY WITH PROPOFOL
Anesthesia: General

## 2023-10-17 MED ORDER — PROPOFOL 10 MG/ML IV BOLUS
INTRAVENOUS | Status: DC | PRN
  Administered 2023-10-17: 100 ug/kg/min via INTRAVENOUS
  Administered 2023-10-17: 60 mg via INTRAVENOUS
  Administered 2023-10-17: 40 mg via INTRAVENOUS

## 2023-10-17 MED ORDER — LIDOCAINE HCL (CARDIAC) PF 100 MG/5ML IV SOSY
PREFILLED_SYRINGE | INTRAVENOUS | Status: DC | PRN
  Administered 2023-10-17: 60 mg via INTRAVENOUS

## 2023-10-17 MED ORDER — SODIUM CHLORIDE 0.9 % IV SOLN: INTRAVENOUS | Status: DC

## 2023-10-17 MED ORDER — DEXMEDETOMIDINE HCL IN NACL 80 MCG/20ML IV SOLN
INTRAVENOUS | Status: DC | PRN
  Administered 2023-10-17 (×2): 8 ug via INTRAVENOUS

## 2023-10-17 NOTE — Transfer of Care (Signed)
 Immediate Anesthesia Transfer of Care Note  Patient: Emily Carson  Procedure(s) Performed: COLONOSCOPY WITH PROPOFOL  Patient Location: Endoscopy Unit  Anesthesia Type:General  Level of Consciousness: drowsy and patient cooperative  Airway & Oxygen Therapy: Patient Spontanous Breathing  Post-op Assessment: Report given to RN and Post -op Vital signs reviewed and stable  Post vital signs: Reviewed and stable  Last Vitals:  Vitals Value Taken Time  BP 93/51 10/17/23 0921  Temp 37 C 10/17/23 0921  Pulse 65 10/17/23 0921  Resp 14 10/17/23 0921  SpO2 100 % 10/17/23 0921  Vitals shown include unfiled device data.  Last Pain:  Vitals:   10/17/23 0921  TempSrc: Tympanic  PainSc: 0-No pain         Complications: No notable events documented.

## 2023-10-17 NOTE — Anesthesia Preprocedure Evaluation (Signed)
 Anesthesia Evaluation  Patient identified by MRN, date of birth, ID band Patient awake    Reviewed: Allergy & Precautions, H&P , NPO status , Patient's Chart, lab work & pertinent test results, reviewed documented beta blocker date and time   Airway Mallampati: II   Neck ROM: full    Dental  (+) Poor Dentition   Pulmonary neg pulmonary ROS   Pulmonary exam normal        Cardiovascular negative cardio ROS Normal cardiovascular exam Rhythm:regular Rate:Normal     Neuro/Psych negative neurological ROS  negative psych ROS   GI/Hepatic negative GI ROS, Neg liver ROS,,,  Endo/Other  negative endocrine ROS    Renal/GU negative Renal ROS  negative genitourinary   Musculoskeletal   Abdominal   Peds  Hematology negative hematology ROS (+)   Anesthesia Other Findings No past medical history on file. Past Surgical History: 2017: BREAST CYST EXCISION; Right 02/05/2002: CESAREAN SECTION BMI    Body Mass Index: 28.53 kg/m     Reproductive/Obstetrics negative OB ROS                             Anesthesia Physical Anesthesia Plan  ASA: 2  Anesthesia Plan: General   Post-op Pain Management:    Induction:   PONV Risk Score and Plan:   Airway Management Planned:   Additional Equipment:   Intra-op Plan:   Post-operative Plan:   Informed Consent: I have reviewed the patients History and Physical, chart, labs and discussed the procedure including the risks, benefits and alternatives for the proposed anesthesia with the patient or authorized representative who has indicated his/her understanding and acceptance.     Dental Advisory Given  Plan Discussed with: CRNA  Anesthesia Plan Comments:        Anesthesia Quick Evaluation

## 2023-10-17 NOTE — Anesthesia Procedure Notes (Signed)
 Procedure Name: General with mask airway Date/Time: 10/17/2023 9:00 AM  Performed by: Lily Lovings, CRNAPre-anesthesia Checklist: Patient identified, Emergency Drugs available, Suction available and Patient being monitored Patient Re-evaluated:Patient Re-evaluated prior to induction Oxygen Delivery Method: Simple face mask Preoxygenation: Pre-oxygenation with 100% oxygen Induction Type: IV induction Comments: pom

## 2023-10-17 NOTE — Anesthesia Postprocedure Evaluation (Signed)
 Anesthesia Post Note  Patient: Emily Carson  Procedure(s) Performed: COLONOSCOPY WITH PROPOFOL  Patient location during evaluation: PACU Anesthesia Type: General Level of consciousness: awake and alert Pain management: pain level controlled Vital Signs Assessment: post-procedure vital signs reviewed and stable Respiratory status: spontaneous breathing, nonlabored ventilation, respiratory function stable and patient connected to nasal cannula oxygen Cardiovascular status: blood pressure returned to baseline and stable Postop Assessment: no apparent nausea or vomiting Anesthetic complications: no   No notable events documented.   Last Vitals:  Vitals:   10/17/23 0919 10/17/23 0921  BP: (!) 93/51 (!) 93/51  Pulse: 69 77  Resp: 17 14  Temp: 36.4 C 37 C  SpO2: 99% 100%    Last Pain:  Vitals:   10/17/23 0940  TempSrc:   PainSc: 0-No pain                 Yevette Edwards

## 2023-10-17 NOTE — Op Note (Signed)
 [facility] Patient Name: Emily Carson Procedure Date: 10/17/2023 9:01 AM MRN: [id number] Account #: [id number] Date of Birth: 07-Feb-1976 Admit Type: Outpatient Age: 48 Room: [facility] ENDO ROOM 1 Gender: Female Note Status: Finalized Instrument Name: Prentice Docker [id number] Procedure:             Colonoscopy Indications:           Screening for colorectal malignant neoplasm Providers:             Wyline Mood MD, MD Referring MD:          No Local Md, MD (Referring MD) Medicines:             Monitored Anesthesia Care Complications:         No immediate complications. Procedure:             Pre-Anesthesia Assessment:                        - Prior to the procedure, a History and Physical was                         performed, and patient medications, allergies and                         sensitivities were reviewed. The patient's tolerance                         of previous anesthesia was reviewed.                        - The risks and benefits of the procedure and the                         sedation options and risks were discussed with the                         patient. All questions were answered and informed                         consent was obtained.                        - ASA Grade Assessment: II - A patient with mild                         systemic disease.                        After obtaining informed consent, the colonoscope was                         passed under direct vision. Throughout the procedure,                         the patient's blood pressure, pulse, and oxygen                         saturations were monitored continuously. The                         Colonoscope was  introduced through the anus and                         advanced to the the cecum, identified by the                         appendiceal orifice. The colonoscopy was performed                         with ease. The patient tolerated the procedure well.                          The quality of the bowel preparation was good. The                         ileocecal valve, appendiceal orifice, and rectum were                         photographed. Findings:      The perianal and digital rectal examinations were normal.      Multiple small-mouthed diverticula were found in the sigmoid colon.      The exam was otherwise without abnormality on direct and retroflexion       views. Impression:            - Diverticulosis in the sigmoid colon.                        - The examination was otherwise normal on direct and                         retroflexion views.                        - No specimens collected. Recommendation:        - Discharge patient to home (with escort).                        - Resume previous diet.                        - Continue present medications.                        - Repeat colonoscopy in 10 years for screening                         purposes. Procedure Code(s):     --- Professional ---                        [phone], Colonoscopy, flexible; diagnostic, including                         collection of specimen(s) by brushing or washing, when                         performed (separate procedure) Diagnosis Code(s):     --- Professional ---                        Z12.11, Encounter for screening for  malignant neoplasm                         of colon                        K57.30, Diverticulosis of large intestine without                         perforation or abscess without bleeding CPT copyright 2022 American [employer] Association. All rights reserved. The codes documented in this report are preliminary and upon coder review may  be revised to meet current compliance requirements. Wyline Mood, MD Wyline Mood MD, MD 10/17/2023 9:17:09 AM This report has been signed electronically. Number of Addenda: 0 Note Initiated On: 10/17/2023 9:01 AM Scope Withdrawal Time: 0 hours 7 minutes 33 seconds  Total Procedure Duration: 0  hours 9 minutes 56 seconds  Estimated Blood Loss:  Estimated blood loss: none.      [facility]

## 2023-10-17 NOTE — H&P (Signed)
 Wyline Mood, MD [address], [place], [place], [address] [address], [place], [place], [address] Phone: [phone]  Fax: [phone]  Primary Care Physician:  Sherlyn Hay, DO   Pre-Procedure History & Physical: HPI:  Emily Carson is a 48 y.o. female is here for an colonoscopy.   No past medical history on file.  Past Surgical History:  Procedure Laterality Date   BREAST CYST EXCISION Right 2017   CESAREAN SECTION  02/05/2002    Prior to Admission medications   Medication Sig Start Date End Date Taking? Authorizing Provider  Vitamin D, Ergocalciferol, (DRISDOL) 1.25 MG (50000 UNIT) CAPS capsule Take 1 capsule (50,000 Units total) by mouth every 7 (seven) days. 09/04/23   Sherlyn Hay, DO    Allergies as of 09/02/2023   (No Known Allergies)    Family History  Problem Relation Age of Onset   Heart disease Mother    Vision loss Maternal Grandmother     Social History   Socioeconomic History   Marital status: Married    Spouse name: Not on file   Number of children: Not on file   Years of education: Not on file   Highest education level: Bachelor's degree (e.g., BA, AB, BS)  Occupational History   Not on file  Tobacco Use   Smoking status: Never   Smokeless tobacco: Never  Vaping Use   Vaping status: Never Used  Substance and Sexual Activity   Alcohol use: Yes    Alcohol/week: 2.0 standard drinks of alcohol    Types: 2 Cans of beer per week    Comment: Sometimes a month   Drug use: Never   Sexual activity: Yes    Birth control/protection: Surgical  Other Topics Concern   Not on file  Social History Narrative   Not on file   Social Drivers of Health   Financial Resource Strain: Low Risk  (08/25/2023)   Overall Financial Resource Strain (CARDIA)    Difficulty of Paying Living Expenses: Not hard at all  Food Insecurity: Patient Declined (08/25/2023)   Hunger Vital Sign    Worried About Running Out of Food in the  Last Year: Patient declined    Ran Out of Food in the Last Year: Patient declined  Transportation Needs: No Transportation Needs (08/25/2023)   PRAPARE - Administrator, Civil Service (Medical): No    Lack of Transportation (Non-Medical): No  Physical Activity: Insufficiently Active (08/25/2023)   Exercise Vital Sign    Days of Exercise per Week: 3 days    Minutes of Exercise per Session: 10 min  Stress: No Stress Concern Present (08/25/2023)   [employer] of Occupational Health - Occupational Stress Questionnaire    Feeling of Stress : Only a little  Social Connections: Unknown (08/25/2023)   Social Connection and Isolation Panel [NHANES]    Frequency of Communication with Friends and Family: Once a week    Frequency of Social Gatherings with Friends and Family: Patient declined    Attends Religious Services: More than 4 times per year    Active Member of [employer] or Organizations: Yes    Attends Engineer, structural: More than 4 times per year    Marital Status: Married  Catering manager Violence: Not on file    Review of Systems: See HPI, otherwise negative ROS  Physical Exam: Temp (!) 96.9 F (36.1 C) (Temporal)   Resp 16   Ht 5\' 1"  (1.549 m)  Wt 68.5 kg   BMI 28.53 kg/m  General:   Alert,  pleasant and cooperative in NAD Head:  Normocephalic and atraumatic. Neck:  Supple; no masses or thyromegaly. Lungs:  Clear throughout to auscultation, normal respiratory effort.    Heart:  +S1, +S2, Regular rate and rhythm, No edema. Abdomen:  Soft, nontender and nondistended. Normal bowel sounds, without guarding, and without rebound.   Neurologic:  Alert and  oriented x4;  grossly normal neurologically.  Impression/Plan: Emily Carson is here for an colonoscopy to be performed for Screening colonoscopy average risk   Risks, benefits, limitations, and alternatives regarding  colonoscopy have been reviewed with the patient.  Questions have been answered.  All  parties agreeable.   Wyline Mood, MD  10/17/2023, 8:49 AM

## 2023-10-18 ENCOUNTER — Encounter: Payer: Self-pay | Admitting: Gastroenterology

## 2023-11-16 ENCOUNTER — Ambulatory Visit: Payer: 59 | Admitting: Family Medicine

## 2023-12-20 ENCOUNTER — Other Ambulatory Visit: Payer: Self-pay | Admitting: Family Medicine

## 2023-12-20 DIAGNOSIS — N6489 Other specified disorders of breast: Secondary | ICD-10-CM

## 2023-12-20 DIAGNOSIS — Z1231 Encounter for screening mammogram for malignant neoplasm of breast: Secondary | ICD-10-CM

## 2023-12-20 DIAGNOSIS — R928 Other abnormal and inconclusive findings on diagnostic imaging of breast: Secondary | ICD-10-CM

## 2023-12-21 ENCOUNTER — Ambulatory Visit: Admitting: Family Medicine

## 2023-12-27 ENCOUNTER — Ambulatory Visit: Admitting: Family Medicine

## 2023-12-28 ENCOUNTER — Encounter (HOSPITAL_COMMUNITY): Payer: Self-pay

## 2024-01-04 ENCOUNTER — Ambulatory Visit
Admission: RE | Admit: 2024-01-04 | Discharge: 2024-01-04 | Disposition: A | Discharge status: Home / Self Care | Source: Ambulatory Visit | Attending: Family Medicine | Admitting: Family Medicine

## 2024-01-04 DIAGNOSIS — N6489 Other specified disorders of breast: Secondary | ICD-10-CM | POA: Diagnosis present

## 2024-01-04 DIAGNOSIS — Z1231 Encounter for screening mammogram for malignant neoplasm of breast: Secondary | ICD-10-CM | POA: Diagnosis present

## 2024-01-04 DIAGNOSIS — R928 Other abnormal and inconclusive findings on diagnostic imaging of breast: Secondary | ICD-10-CM

## 2024-01-10 ENCOUNTER — Ambulatory Visit: Payer: Self-pay | Admitting: Family Medicine

## 2024-01-31 ENCOUNTER — Ambulatory Visit: Admitting: Family Medicine
